# Patient Record
Sex: Female | Born: 1970 | Race: Black or African American | Hispanic: No | Marital: Married | Smoking: Never smoker
Health system: Southern US, Community
[De-identification: ages and names within clinical notes are randomized; demographics above are authoritative.]

## PROBLEM LIST (undated history)

## (undated) DIAGNOSIS — G43909 Migraine, unspecified, not intractable, without status migrainosus: Secondary | ICD-10-CM

## (undated) DIAGNOSIS — N390 Urinary tract infection, site not specified: Secondary | ICD-10-CM

## (undated) HISTORY — PX: TUBAL LIGATION: SHX77

## (undated) HISTORY — DX: Migraine, unspecified, not intractable, without status migrainosus: G43.909

## (undated) HISTORY — DX: Urinary tract infection, site not specified: N39.0

---

## 2018-08-30 ENCOUNTER — Ambulatory Visit: Payer: Self-pay | Admitting: Internal Medicine

## 2018-08-31 ENCOUNTER — Telehealth: Payer: Self-pay | Admitting: Internal Medicine

## 2018-08-31 NOTE — Telephone Encounter (Signed)
Social Work Intern Akon Reinoso Roberts-Kollie called Jody Mills and left her a message welcoming her to [facility] and gave information about mental health services we provide.

## 2018-09-22 ENCOUNTER — Encounter: Payer: Self-pay | Admitting: Family Medicine

## 2018-09-22 ENCOUNTER — Ambulatory Visit: Payer: BLUE CROSS/BLUE SHIELD | Admitting: Family Medicine

## 2018-09-22 VITALS — BP 102/80 | HR 82 | Temp 98.6°F | Ht 65.0 in | Wt 180.0 lb

## 2018-09-22 DIAGNOSIS — Z9851 Tubal ligation status: Secondary | ICD-10-CM | POA: Insufficient documentation

## 2018-09-22 DIAGNOSIS — Z1239 Encounter for other screening for malignant neoplasm of breast: Secondary | ICD-10-CM | POA: Diagnosis not present

## 2018-09-22 DIAGNOSIS — Z7689 Persons encountering health services in other specified circumstances: Secondary | ICD-10-CM | POA: Diagnosis not present

## 2018-09-22 NOTE — Patient Instructions (Signed)

## 2018-09-22 NOTE — Progress Notes (Signed)
Patient presents to clinic today to establish care.  SUBJECTIVE: PMH: Pt is a 47 yo female with no sig pmh.  Pt has not been to a doctor in over 6 yrs.  Pt notes having a bad experience with the death of her brother during a surgical procedure.  Pt is here today as she would like to get a mammogram.  Allergies: NKDA  Past surgical history: -Tubal ligation  Social history: Pt is married.  Pt has 3 kids (1 son, and 2 daughters).  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  Pt is from [place], [place].    Family medical history: Mom-desc, arthritis, heart dz, HLD, HTN, DM Dad-desc Sister, Kenney Houseman, asthma, cancer, HLD Sister-Betty, birth defects,  Brother, Jeneen Rinks Jr.-desc, birth defects Son-Vincent Jr- Asthma Paternal uncle-colon cancer MGF-throat cancer GM-DM Sister, Brenda-DM, HTN  Health Maintenance: Dental -- 4 months ago Vision -- 2 months ago Immunizations -- does not get influenza vaccines LMP--08/30/18  History reviewed. No pertinent past medical history.  Past Surgical History:  Procedure Laterality Date  . TUBAL LIGATION      No current outpatient medications on file prior to visit.   No current facility-administered medications on file prior to visit.     No Known Allergies  Family History  Problem Relation Age of Onset  . Diabetes Mother   . Heart disease Mother   . Hypertension Mother   . Hypertension Father   . Diabetes Father     Social History   Socioeconomic History  . Marital status: Married    Spouse name: Not on file  . Number of children: Not on file  . Years of education: Not on file  . Highest education level: Not on file  Occupational History  . Not on file  Social Needs  . Financial resource strain: Not on file  . Food insecurity:    Worry: Not on file    Inability: Not on file  . Transportation needs:    Medical: Not on file    Non-medical: Not on file  Tobacco Use  . Smoking status:  Never Smoker  . Smokeless tobacco: Never Used  Substance and Sexual Activity  . Alcohol use: Yes  . Drug use: Never  . Sexual activity: Not on file  Lifestyle  . Physical activity:    Days per week: Not on file    Minutes per session: Not on file  . Stress: Not on file  Relationships  . Social connections:    Talks on phone: Not on file    Gets together: Not on file    Attends religious service: Not on file    Active member of club or organization: Not on file    Attends meetings of clubs or organizations: Not on file    Relationship status: Not on file  . Intimate partner violence:    Fear of current or ex partner: Not on file    Emotionally abused: Not on file    Physically abused: Not on file    Forced sexual activity: Not on file  Other Topics Concern  . Not on file  Social History Narrative  . Not on file    ROS General: Denies fever, chills, night sweats, changes in weight, changes in appetite HEENT: Denies headaches, ear pain, changes in vision, rhinorrhea, sore throat CV: Denies CP, palpitations, SOB, orthopnea Pulm: Denies SOB, cough, wheezing GI: Denies abdominal pain, nausea, vomiting, diarrhea, constipation  GU: Denies dysuria, hematuria, frequency, vaginal discharge Msk: Denies muscle cramps, joint pains Neuro: Denies weakness, numbness, tingling Skin: Denies rashes, bruising Psych: Denies depression, anxiety, hallucinations  BP 102/80 (BP Location: Left Arm, Patient Position: Sitting, Cuff Size: Normal)   Pulse 82   Temp 98.6 F (37 C) (Oral)   Ht 5' 5"  (1.651 m)   Wt 180 lb (81.6 kg)   LMP 08/30/2018 (Exact Date)   SpO2 98%   BMI 29.95 kg/m   Physical Exam Gen. Pleasant, well developed, well-nourished, in NAD HEENT - [place]/AT, PERRL,no scleral icterus, no nasal drainage, poor dentition, pharynx without erythema or exudate. Lungs: no use of accessory muscles, CTAB, no wheezes, rales or rhonchi Cardiovascular: RRR, No r/g/m, no peripheral  edema Neuro:  A&Ox3, CN II-XII intact, normal gait Skin:  Warm, dry, intact, no lesions  No results found for this or any previous visit (from the past 2160 hour(s)).  Assessment/Plan: History of tubal ligation  Breast Cancer screening -discussed scheduling appt for mammogram -pt given handout with info  Encounter to establish care -We reviewed the PMH, PSH, FH, SH, Meds and Allergies. -We provided refills for any medications we will prescribe as needed. -We addressed current concerns per orders and patient instructions. -We have asked for records for pertinent exams, studies, vaccines and notes from previous providers. -We have advised patient to follow up per instructions below.  F/u for CPE  Grier Mitts, MD

## 2018-09-23 ENCOUNTER — Other Ambulatory Visit: Payer: Self-pay | Admitting: Family Medicine

## 2018-09-23 DIAGNOSIS — Z1231 Encounter for screening mammogram for malignant neoplasm of breast: Secondary | ICD-10-CM

## 2018-09-25 ENCOUNTER — Encounter: Payer: Self-pay | Admitting: Family Medicine

## 2018-09-27 ENCOUNTER — Encounter: Payer: BLUE CROSS/BLUE SHIELD | Admitting: Family Medicine

## 2018-09-28 NOTE — Telephone Encounter (Signed)
Pt CPE was recheduled for 09/30/2018

## 2018-09-29 ENCOUNTER — Ambulatory Visit
Admission: RE | Admit: 2018-09-29 | Discharge: 2018-09-29 | Disposition: A | Discharge status: Home / Self Care | Payer: BLUE CROSS/BLUE SHIELD | Source: Ambulatory Visit | Attending: Family Medicine | Admitting: Family Medicine

## 2018-09-29 ENCOUNTER — Encounter: Payer: Self-pay | Admitting: Family Medicine

## 2018-09-29 DIAGNOSIS — Z1231 Encounter for screening mammogram for malignant neoplasm of breast: Secondary | ICD-10-CM

## 2018-09-30 ENCOUNTER — Other Ambulatory Visit (HOSPITAL_COMMUNITY)
Admission: RE | Admit: 2018-09-30 | Discharge: 2018-09-30 | Disposition: A | Discharge status: Home / Self Care | Payer: BLUE CROSS/BLUE SHIELD | Source: Ambulatory Visit | Attending: Family Medicine | Admitting: Family Medicine

## 2018-09-30 ENCOUNTER — Encounter: Payer: Self-pay | Admitting: Family Medicine

## 2018-09-30 ENCOUNTER — Ambulatory Visit: Payer: BLUE CROSS/BLUE SHIELD | Admitting: Family Medicine

## 2018-09-30 VITALS — BP 108/78 | HR 56 | Temp 98.3°F | Wt 181.0 lb

## 2018-09-30 DIAGNOSIS — Z13220 Encounter for screening for lipoid disorders: Secondary | ICD-10-CM

## 2018-09-30 DIAGNOSIS — Z0000 Encounter for general adult medical examination without abnormal findings: Secondary | ICD-10-CM | POA: Diagnosis not present

## 2018-09-30 DIAGNOSIS — Z113 Encounter for screening for infections with a predominantly sexual mode of transmission: Secondary | ICD-10-CM

## 2018-09-30 DIAGNOSIS — Z124 Encounter for screening for malignant neoplasm of cervix: Secondary | ICD-10-CM | POA: Insufficient documentation

## 2018-09-30 DIAGNOSIS — Z131 Encounter for screening for diabetes mellitus: Secondary | ICD-10-CM | POA: Diagnosis not present

## 2018-09-30 LAB — CBC WITH DIFFERENTIAL/PLATELET
Basophils Absolute: 0 10*3/uL (ref 0.0–0.1)
Basophils Relative: 0.7 % (ref 0.0–3.0)
Eosinophils Absolute: 0.2 10*3/uL (ref 0.0–0.7)
Eosinophils Relative: 2.9 % (ref 0.0–5.0)
HCT: 37.6 % (ref 36.0–46.0)
Hemoglobin: 12.8 g/dL (ref 12.0–15.0)
LYMPHS ABS: 2.4 10*3/uL (ref 0.7–4.0)
Lymphocytes Relative: 37 % (ref 12.0–46.0)
MCHC: 34 g/dL (ref 30.0–36.0)
MCV: 96.4 fl (ref 78.0–100.0)
MONO ABS: 0.4 10*3/uL (ref 0.1–1.0)
Monocytes Relative: 6.4 % (ref 3.0–12.0)
NEUTROS PCT: 53 % (ref 43.0–77.0)
Neutro Abs: 3.5 10*3/uL (ref 1.4–7.7)
Platelets: 316 10*3/uL (ref 150.0–400.0)
RBC: 3.9 Mil/uL (ref 3.87–5.11)
RDW: 12.6 % (ref 11.5–15.5)
WBC: 6.6 10*3/uL (ref 4.0–10.5)

## 2018-09-30 LAB — BASIC METABOLIC PANEL
BUN: 11 mg/dL (ref 6–23)
CO2: 29 mEq/L (ref 19–32)
Calcium: 9.4 mg/dL (ref 8.4–10.5)
Chloride: 100 mEq/L (ref 96–112)
Creatinine, Ser: 0.74 mg/dL (ref 0.40–1.20)
GFR: 108.78 mL/min (ref >60.00)
Glucose, Bld: 92 mg/dL (ref 70–99)
Potassium: 4.6 mEq/L (ref 3.5–5.1)
Sodium: 137 mEq/L (ref 135–145)

## 2018-09-30 LAB — LIPID PANEL
CHOLESTEROL: 230 mg/dL — AB (ref 0–200)
HDL: 73.2 mg/dL (ref >39.00)
LDL Cholesterol: 147 mg/dL — ABNORMAL HIGH (ref 0–99)
NonHDL: 156.77
Total CHOL/HDL Ratio: 3
Triglycerides: 49 mg/dL (ref 0.0–149.0)
VLDL: 9.8 mg/dL (ref 0.0–40.0)

## 2018-09-30 LAB — HEMOGLOBIN A1C: Hgb A1c MFr Bld: 5.9 % (ref 4.6–6.5)

## 2018-09-30 NOTE — Progress Notes (Signed)
Subjective:     Jody Mills is a 47 y.o. female and is here for a comprehensive physical exam. The patient reports no problems.  Pt states she had her mammogram yesterday.    Social History   Socioeconomic History  . Marital status: Married    Spouse name: Not on file  . Number of children: Not on file  . Years of education: Not on file  . Highest education level: Not on file  Occupational History  . Not on file  Social Needs  . Financial resource strain: Not on file  . Food insecurity:    Worry: Not on file    Inability: Not on file  . Transportation needs:    Medical: Not on file    Non-medical: Not on file  Tobacco Use  . Smoking status: Never Smoker  . Smokeless tobacco: Never Used  Substance and Sexual Activity  . Alcohol use: Yes  . Drug use: Never  . Sexual activity: Yes  Lifestyle  . Physical activity:    Days per week: Not on file    Minutes per session: Not on file  . Stress: Not on file  Relationships  . Social connections:    Talks on phone: Not on file    Gets together: Not on file    Attends religious service: Not on file    Active member of club or organization: Not on file    Attends meetings of clubs or organizations: Not on file    Relationship status: Not on file  . Intimate partner violence:    Fear of current or ex partner: Not on file    Emotionally abused: Not on file    Physically abused: Not on file    Forced sexual activity: Not on file  Other Topics Concern  . Not on file  Social History Narrative  . Not on file   Health Maintenance  Topic Date Due  . HIV Screening  12/31/1987  . TETANUS/TDAP  12/31/1991  . PAP SMEAR-Modifier  12/30/1993  . INFLUENZA VACCINE  05/19/2018    The following portions of the patient's history were reviewed and updated as appropriate: allergies, current medications, past family history, past medical history, past social history, past surgical history and problem list.  Review of  Systems Pertinent items noted in HPI and remainder of comprehensive ROS otherwise negative.   Objective:    There were no vitals taken for this visit. General appearance: alert, cooperative, appears stated age and no distress Head: Normocephalic, without obvious abnormality, atraumatic Eyes: conjunctivae/corneas clear. PERRL, EOM's intact. Fundi benign. Ears: normal TM's and external ear canals both ears Nose: Nares normal. Septum midline. Mucosa normal. No drainage or sinus tenderness. Throat: lips, mucosa, and tongue normal; teeth and gums normal Neck: no adenopathy, no carotid bruit, no JVD, supple, symmetrical, trachea midline and thyroid not enlarged, symmetric, no tenderness/mass/nodules Lungs: clear to auscultation bilaterally Heart: regular rate and rhythm, S1, S2 normal, no murmur, click, rub or gallop Abdomen: soft, non-tender; bowel sounds normal; no masses,  no organomegaly Pelvic: cervix normal in appearance, external genitalia normal, no adnexal masses or tenderness, no cervical motion tenderness, rectovaginal septum normal, uterus normal size, shape, and consistency and vagina normal without discharge Skin: Skin color, texture, turgor normal. No rashes or lesions Neurologic: Alert and oriented X 3, normal strength and tone. Normal symmetric reflexes. Normal coordination and gait    Assessment:    Healthy female exam.      Plan:  Anticipatory guidance given including wearing seatbelts, smoke detectors in the home, increasing physical activity, increasing p.o. intake of water and vegetables. -We will obtain labs -Given handout -mammogram done 09/29/18 -Next CPE in 1 year See After Visit Summary for Counseling Recommendations    STI screening - Plan: RPR, HIV, GC, trich, yeast  Mammogram results -pt advised of possible mass and asymmetry in the right breast.  Left breast with possible calcifications.  Diagnostic mammogram and possible ultrasound of bilateral  breasts recommended. -would be contacted by the breast imaging center in regards to these results.   -Pt expressed understanding.  Follow-up PRN  Grier Mitts, MD

## 2018-09-30 NOTE — Patient Instructions (Signed)
Preventive Care 40-64 Years, Female Preventive care refers to lifestyle choices and visits with your health care provider that can promote health and wellness. What does preventive care include?  A yearly physical exam. This is also called an annual well check.  Dental exams once or twice a year.  Routine eye exams. Ask your health care provider how often you should have your eyes checked.  Personal lifestyle choices, including: ? Daily care of your teeth and gums. ? Regular physical activity. ? Eating a healthy diet. ? Avoiding tobacco and drug use. ? Limiting alcohol use. ? Practicing safe sex. ? Taking low-dose aspirin daily starting at age 45. ? Taking vitamin and mineral supplements as recommended by your health care provider. What happens during an annual well check? The services and screenings done by your health care provider during your annual well check will depend on your age, overall health, lifestyle risk factors, and family history of disease. Counseling Your health care provider may ask you questions about your:  Alcohol use.  Tobacco use.  Drug use.  Emotional well-being.  Home and relationship well-being.  Sexual activity.  Eating habits.  Work and work Statistician.  Method of birth control.  Menstrual cycle.  Pregnancy history.  Screening You may have the following tests or measurements:  Height, weight, and BMI.  Blood pressure.  Lipid and cholesterol levels. These may be checked every 5 years, or more frequently if you are over 45 years old.  Skin check.  Lung cancer screening. You may have this screening every year starting at age 45 if you have a 30-pack-year history of smoking and currently smoke or have quit within the past 15 years.  Fecal occult blood test (FOBT) of the stool. You may have this test every year starting at age 45.  Flexible sigmoidoscopy or colonoscopy. You may have a sigmoidoscopy every 5 years or a colonoscopy  every 10 years starting at age 45.  Hepatitis C blood test.  Hepatitis B blood test.  Sexually transmitted disease (STD) testing.  Diabetes screening. This is done by checking your blood sugar (glucose) after you have not eaten for a while (fasting). You may have this done every 1-3 years.  Mammogram. This may be done every 1-2 years. Talk to your health care provider about when you should start having regular mammograms. This may depend on whether you have a family history of breast cancer.  BRCA-related cancer screening. This may be done if you have a family history of breast, ovarian, tubal, or peritoneal cancers.  Pelvic exam and Pap test. This may be done every 3 years starting at age 45. Starting at age 36, this may be done every 5 years if you have a Pap test in combination with an HPV test.  Bone density scan. This is done to screen for osteoporosis. You may have this scan if you are at high risk for osteoporosis.  Discuss your test results, treatment options, and if necessary, the need for more tests with your health care provider. Vaccines Your health care provider may recommend certain vaccines, such as:  Influenza vaccine. This is recommended every year.  Tetanus, diphtheria, and acellular pertussis (Tdap, Td) vaccine. You may need a Td booster every 10 years.  Varicella vaccine. You may need this if you have not been vaccinated.  Zoster vaccine. You may need this after age 5.  Measles, mumps, and rubella (MMR) vaccine. You may need at least one dose of MMR if you were born in  1957 or later. You may also need a second dose.  Pneumococcal 13-valent conjugate (PCV13) vaccine. You may need this if you have certain conditions and were not previously vaccinated.  Pneumococcal polysaccharide (PPSV23) vaccine. You may need one or two doses if you smoke cigarettes or if you have certain conditions.  Meningococcal vaccine. You may need this if you have certain  conditions.  Hepatitis A vaccine. You may need this if you have certain conditions or if you travel or work in places where you may be exposed to hepatitis A.  Hepatitis B vaccine. You may need this if you have certain conditions or if you travel or work in places where you may be exposed to hepatitis B.  Haemophilus influenzae type b (Hib) vaccine. You may need this if you have certain conditions.  Talk to your health care provider about which screenings and vaccines you need and how often you need them. This information is not intended to replace advice given to you by your health care provider. Make sure you discuss any questions you have with your health care provider. Document Released: 11/01/2015 Document Revised: 06/24/2016 Document Reviewed: 08/06/2015 Elsevier Interactive Patient Education  2018 [employer] Inc[employer]  

## 2018-09-30 NOTE — Progress Notes (Addendum)
Disregard this entry

## 2018-10-03 ENCOUNTER — Other Ambulatory Visit: Payer: Self-pay | Admitting: Family Medicine

## 2018-10-03 DIAGNOSIS — R928 Other abnormal and inconclusive findings on diagnostic imaging of breast: Secondary | ICD-10-CM

## 2018-10-03 LAB — HIV ANTIBODY (ROUTINE TESTING W REFLEX): HIV 1&2 Ab, 4th Generation: NONREACTIVE

## 2018-10-03 LAB — RPR: RPR: NONREACTIVE

## 2018-10-05 ENCOUNTER — Ambulatory Visit
Admission: RE | Admit: 2018-10-05 | Discharge: 2018-10-05 | Disposition: A | Discharge status: Home / Self Care | Payer: BLUE CROSS/BLUE SHIELD | Source: Ambulatory Visit | Attending: Family Medicine | Admitting: Family Medicine

## 2018-10-05 ENCOUNTER — Ambulatory Visit: Payer: BLUE CROSS/BLUE SHIELD

## 2018-10-05 ENCOUNTER — Other Ambulatory Visit: Payer: Self-pay | Admitting: Family Medicine

## 2018-10-05 DIAGNOSIS — R928 Other abnormal and inconclusive findings on diagnostic imaging of breast: Secondary | ICD-10-CM

## 2018-10-05 DIAGNOSIS — N6001 Solitary cyst of right breast: Secondary | ICD-10-CM

## 2018-10-06 ENCOUNTER — Encounter: Payer: Self-pay | Admitting: Family Medicine

## 2018-10-06 LAB — CYTOLOGY - PAP
Bacterial vaginitis: POSITIVE — AB
Candida vaginitis: NEGATIVE
Chlamydia: NEGATIVE
Diagnosis: UNDETERMINED — AB
HPV (WINDOPATH): NOT DETECTED
NEISSERIA GONORRHEA: NEGATIVE
Trichomonas: NEGATIVE

## 2018-10-10 ENCOUNTER — Other Ambulatory Visit: Payer: Self-pay | Admitting: Family Medicine

## 2018-10-10 MED ORDER — METRONIDAZOLE 500 MG PO TABS: 500.0000 mg | ORAL_TABLET | Freq: Two times a day (BID) | ORAL | 0 refills | Status: AC

## 2018-11-03 ENCOUNTER — Ambulatory Visit: Payer: BLUE CROSS/BLUE SHIELD

## 2019-03-22 ENCOUNTER — Other Ambulatory Visit: Payer: Self-pay

## 2019-03-22 ENCOUNTER — Ambulatory Visit
Admission: RE | Admit: 2019-03-22 | Discharge: 2019-03-22 | Disposition: A | Discharge status: Home / Self Care | Payer: BLUE CROSS/BLUE SHIELD | Source: Ambulatory Visit | Attending: Family Medicine | Admitting: Family Medicine

## 2019-03-22 DIAGNOSIS — N6001 Solitary cyst of right breast: Secondary | ICD-10-CM

## 2019-04-10 ENCOUNTER — Other Ambulatory Visit: Payer: BLUE CROSS/BLUE SHIELD

## 2019-11-16 ENCOUNTER — Other Ambulatory Visit: Payer: Self-pay | Admitting: Family Medicine

## 2019-11-16 DIAGNOSIS — Z1231 Encounter for screening mammogram for malignant neoplasm of breast: Secondary | ICD-10-CM

## 2019-11-20 ENCOUNTER — Ambulatory Visit
Admission: RE | Admit: 2019-11-20 | Discharge: 2019-11-20 | Disposition: A | Discharge status: Home / Self Care | Payer: BC Managed Care – PPO | Source: Ambulatory Visit | Attending: Family Medicine | Admitting: Family Medicine

## 2019-11-20 ENCOUNTER — Other Ambulatory Visit: Payer: Self-pay

## 2019-11-20 DIAGNOSIS — Z1231 Encounter for screening mammogram for malignant neoplasm of breast: Secondary | ICD-10-CM

## 2019-11-21 LAB — HM MAMMOGRAPHY

## 2019-12-07 ENCOUNTER — Encounter: Payer: Self-pay | Admitting: Family Medicine

## 2020-07-31 ENCOUNTER — Ambulatory Visit: Payer: BC Managed Care – PPO | Admitting: Family Medicine

## 2020-07-31 ENCOUNTER — Other Ambulatory Visit: Payer: Self-pay

## 2020-07-31 ENCOUNTER — Encounter: Payer: Self-pay | Admitting: Family Medicine

## 2020-07-31 VITALS — BP 110/78 | HR 62 | Temp 98.8°F | Wt 190.0 lb

## 2020-07-31 DIAGNOSIS — G8929 Other chronic pain: Secondary | ICD-10-CM | POA: Diagnosis not present

## 2020-07-31 DIAGNOSIS — M545 Low back pain, unspecified: Secondary | ICD-10-CM | POA: Diagnosis not present

## 2020-07-31 MED ORDER — CYCLOBENZAPRINE HCL 5 MG PO TABS: 5.0000 mg | ORAL_TABLET | Freq: Every evening | ORAL | 0 refills | Status: DC | PRN

## 2020-07-31 MED ORDER — MELOXICAM 7.5 MG PO TABS: 7.5000 mg | ORAL_TABLET | Freq: Every day | ORAL | 0 refills | Status: DC

## 2020-07-31 NOTE — Progress Notes (Signed)
Subjective:    Patient ID: Jody Mills, female    DOB: 05-Sep-1971, 49 y.o.   MRN: [id number]  No chief complaint on file.   HPI Patient was seen today for ongoing concern.  Pt notes initially back injury several yrs ago after falling down stairs while pregnant.  Since then pt having intermittent midline, low back pain.  Pain increasing in intensity 7-8/10 over the last 4-5 months.  Denies new injury, hearing any pops, clicks, or tears.  Doing more cleaning around the house.  Pt tried aleeve and a back massager with little to no relief. When up walking pain may be 2-3/10.  Sitting provides some relief. Pain worse with laying down.  Pt denies loss of bowel or bladder, numbness or tingling in LEs.  . Pt was helping to take care of her grandkids, but her daughter moved to [place], [place].   Past Medical History:  Diagnosis Date  . Migraine   . UTI (urinary tract infection)     No Known Allergies  ROS General: Denies fever, chills, night sweats, changes in weight, changes in appetite HEENT: Denies headaches, ear pain, changes in vision, rhinorrhea, sore throat CV: Denies CP, palpitations, SOB, orthopnea Pulm: Denies SOB, cough, wheezing GI: Denies abdominal pain, nausea, vomiting, diarrhea, constipation GU: Denies dysuria, hematuria, frequency, vaginal discharge Msk: Denies muscle cramps, joint pains  +low back pain Neuro: Denies weakness, numbness, tingling    Skin: Denies rashes, bruising Psych: Denies depression, anxiety, hallucinations    Objective:    Blood pressure 110/78, pulse 62, temperature 98.8 F (37.1 C), temperature source Oral, weight 190 lb (86.2 kg), SpO2 98 %.   Gen. Pleasant, well-nourished, in no distress, normal affect   HEENT: [place]/AT, face symmetric, conjunctiva clear, no scleral icterus, PERRLA, EOMI, nares patent without drainage Lungs: no accessory muscle use Cardiovascular: RRR, no peripheral edema Musculoskeletal: TTP of lumbar spine and  paraspinal muscles b/l.  No TTP on cervical, thoracic spine or paraspinal muscles.  Negative log roll, straight leg raise, FADIR or FABER.  No deformities, no cyanosis or clubbing, normal tone Neuro:  A&Ox3, CN II-XII intact, normal gait Skin:  Warm, no lesions/ rash   Wt Readings from Last 3 Encounters:  07/31/20 190 lb (86.2 kg)  09/30/18 181 lb (82.1 kg)  09/22/18 180 lb (81.6 kg)    Lab Results  Component Value Date   WBC 6.6 09/30/2018   HGB 12.8 09/30/2018   HCT 37.6 09/30/2018   PLT 316.0 09/30/2018   GLUCOSE 92 09/30/2018   CHOL 230 (H) 09/30/2018   TRIG 49.0 09/30/2018   HDL 73.20 09/30/2018   LDLCALC 147 (H) 09/30/2018   NA 137 09/30/2018   K 4.6 09/30/2018   CL 100 09/30/2018   CREATININE 0.74 09/30/2018   BUN 11 09/30/2018   CO2 29 09/30/2018   HGBA1C 5.9 09/30/2018    Assessment/Plan:  Chronic midline low back pain without sciatica  -discussed possible causes including degenerative changes, stenosis, muscle strain, etc. -continue supportive care including heat, massage, ice, stretching, NSAIDs as needed -Given increasing symptoms will obtain imaging -Pt to also try Flexeril at night in the event it causes drowsiness.    -Avoid taking NSAIDs on empty stomach -We will make further recommendations based on imaging.  If needed consider PT and referral to Ortho. - Plan: DG Lumbar Spine Complete, cyclobenzaprine (FLEXERIL) 5 MG tablet, meloxicam (MOBIC) 7.5 MG tablet  F/u prn  Grier Mitts, MD

## 2020-07-31 NOTE — Patient Instructions (Signed)
What You Need to Know About Chronic Back Pain Long-term (chronic) back pain is back pain that lasts for 12 weeks or longer. It often affects the lower back and can range from mild to severe. Many people have back pain at some point in their lives. It can feel different to each person. It may feel like a muscle ache or a sharp, stabbing pain. The pain often gets worse over time. It can be difficult to find the cause of chronic back pain. Treating chronic back pain often starts with rest and pain relief, followed by exercises (physical therapy) to strengthen the muscles that support your back. You may have to try different things to see what works best for you. If other treatments do not help, or if your pain is caused by a condition or an injury, you may need surgery. How can back pain affect me? Chronic back pain is uncomfortable and can make it hard to do your usual daily activities. Chronic back pain can:  Cause numbness and tingling.  Come and go.  Get worse when you are sitting, standing, walking, bending, or lifting.  Affect you while you are active, at rest, or both.  Eventually make it hard to move around.  Occur with fever, weight loss, or difficulty urinating. What are the benefits of treating back pain? Treating chronic back pain may:  Relieve pain.  Keep your pain from getting worse.  Make it easier for you to do your usual activities. What are some steps I can take to decrease my back pain?   Take over-the-counter or prescription medicines only as told by your health care provider.  If directed, apply heat to the affected area. Use the heat source that your health care provider recommends, such as a moist heat pack or a heating pad. ? Place a towel between your skin and the heat source. ? Leave the heat on for 20-30 minutes. ? Remove the heat if your skin turns bright red. This is especially important if you are unable to feel pain, heat, or cold. You may have a greater  risk of getting burned.  If directed, put ice on the affected area: ? Put ice in a plastic bag. ? Place a towel between your skin and the bag. ? Leave the ice on for 20 minutes, 2-3 times a [place].  Get regular exercise as told by your health care provider to improve flexibility and strength.  Do not smoke.  Maintain a healthy weight.  When lifting objects: ? Keep your feet as far apart as your shoulders (shoulder-width apart) or farther apart. ? Tighten the muscles in your abdomen. ? Bend your knees and hips and keep your spine neutral. It is important to lift using the strength of your legs, not your back. Do not lock your knees straight out. ? Always ask for help to lift heavy or awkward objects. What can happen if my back pain goes untreated? Untreated back pain can:  Get worse over time.  Start to occur more often or at different times, such as when you are resting.  Cause posture problems.  Make it hard to move around (limit mobility). Where can I get support? Chronic back pain can be a frustrating condition to manage. It may help to talk with other people who are having a similar experience. Consider joining a support group for people dealing with chronic back pain. Ask your health care provider about support groups in your area. You can also find online and  in-person support groups through:  The American Chronic Pain Association: [employer]uxeOption[employer]si  The [place] Pain Foundation: uspainfoundation.org/support-groups Contact a health care provider if:  Your symptoms do not get better or they get worse.  You have severe back pain.  You have chronic back pain and a fever.  You lose weight without trying.  You have difficulty urinating.  You experience numbness or tingling.  You develop new pain after an injury. Summary  Chronic back pain is often treated with rest, pain relief, and physical therapy.  Get regular exercise to improve your strength and  flexibility.  Put heat and ice on the affected areas as directed by your health care provider.  Chronic back pain can be challenging to live with. Joining a support group may help you manage your condition. This information is not intended to replace advice given to you by your health care provider. Make sure you discuss any questions you have with your health care provider. Document Revised: 09/17/2017 Document Reviewed: 06/13/2016 Elsevier Patient Education  [place].  Chronic Back Pain When back pain lasts longer than 3 months, it is called chronic back pain. Pain may get worse at certain times (flare-ups). There are things you can do at home to manage your pain. Follow these instructions at home: Activity      Avoid bending and other activities that make pain worse.  When standing: ? Keep your upper back and neck straight. ? Keep your shoulders pulled back. ? Avoid slouching.  When sitting: ? Keep your back straight. ? Relax your shoulders. Do not round your shoulders or pull them backward.  Do not sit or stand in one place for long periods of time.  Take short rest breaks during the day. Lying down or standing is usually better than sitting. Resting can help relieve pain.  When sitting or lying down for a long time, do some mild activity or stretching. This will help to prevent stiffness and pain.  Get regular exercise. Ask your doctor what activities are safe for you.  Do not lift anything that is heavier than 10 lb (4.5 kg). To prevent injury when you lift things: ? Bend your knees. ? Keep the weight close to your body. ? Avoid twisting. Managing pain  If told, put ice on the painful area. Your doctor may tell you to use ice for 24-48 hours after a flare-up starts. ? Put ice in a plastic bag. ? Place a towel between your skin and the bag. ? Leave the ice on for 20 minutes, 2-3 times a day.  If told, put heat on the painful area as often as told by your  doctor. Use the heat source that your doctor recommends, such as a moist heat pack or a heating pad. ? Place a towel between your skin and the heat source. ? Leave the heat on for 20-30 minutes. ? Remove the heat if your skin turns bright red. This is especially important if you are unable to feel pain, heat, or cold. You may have a greater risk of getting burned.  Soak in a warm bath. This can help relieve pain.  Take over-the-counter and prescription medicines only as told by your doctor. General instructions  Sleep on a firm mattress. Try lying on your side with your knees slightly bent. If you lie on your back, put a pillow under your knees.  Keep all follow-up visits as told by your doctor. This is important. Contact a doctor if:  You have  pain that does not get better with rest or medicine. Get help right away if:  One or both of your arms or legs feel weak.  One or both of your arms or legs lose feeling (numbness).  You have trouble controlling when you poop (bowel movement) or pee (urinate).  You feel sick to your stomach (nauseous).  You throw up (vomit).  You have belly (abdominal) pain.  You have shortness of breath.  You pass out (faint). Summary  When back pain lasts longer than 3 months, it is called chronic back pain.  Pain may get worse at certain times (flare-ups).  Use ice and heat as told by your doctor. Your doctor may tell you to use ice after flare-ups. This information is not intended to replace advice given to you by your health care provider. Make sure you discuss any questions you have with your health care provider. Document Revised: 01/26/2019 Document Reviewed: 05/20/2017 Elsevier Patient Education  [place] [facility] Ask your health care provider which exercises are safe for you. Do exercises exactly as told by your health care provider and adjust them as directed. It is normal to feel mild stretching,  pulling, tightness, or discomfort as you do these exercises. Stop right away if you feel sudden pain or your pain gets worse. Do not begin these exercises until told by your health care provider. Stretching and range-of-motion exercises These exercises warm up your muscles and joints and improve the movement and flexibility of your back. These exercises also help to relieve pain, numbness, and tingling. Lumbar rotation  1. Lie on your back on a firm surface and bend your knees. 2. Straighten your arms out to your sides so each arm forms a 90-degree angle (right angle) with a side of your body. 3. Slowly move (rotate) both of your knees to one side of your body until you feel a stretch in your lower back (lumbar). Try not to let your shoulders lift off the floor. 4. Hold this position for __________ seconds. 5. Tense your abdominal muscles and slowly move your knees back to the starting position. 6. Repeat this exercise on the other side of your body. Repeat __________ times. Complete this exercise __________ times a day. Single knee to chest  1. Lie on your back on a firm surface with both legs straight. 2. Bend one of your knees. Use your hands to move your knee up toward your chest until you feel a gentle stretch in your lower back and buttock. ? Hold your leg in this position by holding on to the front of your knee. ? Keep your other leg as straight as possible. 3. Hold this position for __________ seconds. 4. Slowly return to the starting position. 5. Repeat with your other leg. Repeat __________ times. Complete this exercise __________ times a day. Prone extension on elbows  1. Lie on your abdomen on a firm surface (prone position). 2. Prop yourself up on your elbows. 3. Use your arms to help lift your chest up until you feel a gentle stretch in your abdomen and your lower back. ? This will place some of your body weight on your elbows. If this is uncomfortable, try stacking pillows  under your chest. ? Your hips should stay down, against the surface that you are lying on. Keep your hip and back muscles relaxed. 4. Hold this position for __________ seconds. 5. Slowly relax your upper body and return to the starting  position. Repeat __________ times. Complete this exercise __________ times a day. Strengthening exercises These exercises build strength and endurance in your back. Endurance is the ability to use your muscles for a long time, even after they get tired. Pelvic tilt This exercise strengthens the muscles that lie deep in the abdomen. 1. Lie on your back on a firm surface. Bend your knees and keep your feet flat on the floor. 2. Tense your abdominal muscles. Tip your pelvis up toward the ceiling and flatten your lower back into the floor. ? To help with this exercise, you may place a small towel under your lower back and try to push your back into the towel. 3. Hold this position for __________ seconds. 4. Let your muscles relax completely before you repeat this exercise. Repeat __________ times. Complete this exercise __________ times a day. Alternating arm and leg raises  1. Get on your hands and knees on a firm surface. If you are on a hard floor, you may want to use padding, such as an exercise mat, to cushion your knees. 2. Line up your arms and legs. Your hands should be directly below your shoulders, and your knees should be directly below your hips. 3. Lift your left leg behind you. At the same time, raise your right arm and straighten it in front of you. ? Do not lift your leg higher than your hip. ? Do not lift your arm higher than your shoulder. ? Keep your abdominal and back muscles tight. ? Keep your hips facing the ground. ? Do not arch your back. ? Keep your balance carefully, and do not hold your breath. 4. Hold this position for __________ seconds. 5. Slowly return to the starting position. 6. Repeat with your right leg and your left  arm. Repeat __________ times. Complete this exercise __________ times a day. Abdominal set with straight leg raise  1. Lie on your back on a firm surface. 2. Bend one of your knees and keep your other leg straight. 3. Tense your abdominal muscles and lift your straight leg up, 4-6 inches (10-15 cm) off the ground. 4. Keep your abdominal muscles tight and hold this position for __________ seconds. ? Do not hold your breath. ? Do not arch your back. Keep it flat against the ground. 5. Keep your abdominal muscles tense as you slowly lower your leg back to the starting position. 6. Repeat with your other leg. Repeat __________ times. Complete this exercise __________ times a day. Single leg lower with bent knees 1. Lie on your back on a firm surface. 2. Tense your abdominal muscles and lift your feet off the floor, one foot at a time, so your knees and hips are bent in 90-degree angles (right angles). ? Your knees should be over your hips and your lower legs should be parallel to the floor. 3. Keeping your abdominal muscles tense and your knee bent, slowly lower one of your legs so your toe touches the ground. 4. Lift your leg back up to return to the starting position. ? Do not hold your breath. ? Do not let your back arch. Keep your back flat against the ground. 5. Repeat with your other leg. Repeat __________ times. Complete this exercise __________ times a day. Posture and body mechanics Good posture and healthy body mechanics can help to relieve stress in your body's tissues and joints. Body mechanics refers to the movements and positions of your body while you do your daily activities. Posture is part  of body mechanics. Good posture means:  Your spine is in its natural S-curve position (neutral).  Your shoulders are pulled back slightly.  Your head is not tipped forward. Follow these guidelines to improve your posture and body mechanics in your everyday  activities. Standing   When standing, keep your spine neutral and your feet about hip width apart. Keep a slight bend in your knees. Your ears, shoulders, and hips should line up.  When you do a task in which you stand in one place for a long time, place one foot up on a stable object that is 2-4 inches (5-10 cm) high, such as a footstool. This helps keep your spine neutral. Sitting   When sitting, keep your spine neutral and keep your feet flat on the floor. Use a footrest, if necessary, and keep your thighs parallel to the floor. Avoid rounding your shoulders, and avoid tilting your head forward.  When working at a desk or a computer, keep your desk at a height where your hands are slightly lower than your elbows. Slide your chair under your desk so you are close enough to maintain good posture.  When working at a computer, place your monitor at a height where you are looking straight ahead and you do not have to tilt your head forward or downward to look at the screen. Resting  When lying down and resting, avoid positions that are most painful for you.  If you have pain with activities such as sitting, bending, stooping, or squatting, lie in a position in which your body does not bend very much. For example, avoid curling up on your side with your arms and knees near your chest (fetal position).  If you have pain with activities such as standing for a long time or reaching with your arms, lie with your spine in a neutral position and bend your knees slightly. Try the following positions: ? Lying on your side with a pillow between your knees. ? Lying on your back with a pillow under your knees. Lifting   When lifting objects, keep your feet at least shoulder width apart and tighten your abdominal muscles.  Bend your knees and hips and keep your spine neutral. It is important to lift using the strength of your legs, not your back. Do not lock your knees straight out.  Always ask for  help to lift heavy or awkward objects. This information is not intended to replace advice given to you by your health care provider. Make sure you discuss any questions you have with your health care provider. Document Revised: 01/27/2019 Document Reviewed: 10/27/2018 Elsevier Patient Education  [place].

## 2020-08-01 ENCOUNTER — Ambulatory Visit (INDEPENDENT_AMBULATORY_CARE_PROVIDER_SITE_OTHER): Payer: BC Managed Care – PPO

## 2020-08-01 ENCOUNTER — Other Ambulatory Visit: Payer: BC Managed Care – PPO

## 2020-08-01 DIAGNOSIS — M545 Low back pain, unspecified: Secondary | ICD-10-CM | POA: Diagnosis not present

## 2020-08-01 DIAGNOSIS — G8929 Other chronic pain: Secondary | ICD-10-CM | POA: Diagnosis not present

## 2020-08-23 ENCOUNTER — Other Ambulatory Visit: Payer: Self-pay | Admitting: Family Medicine

## 2020-08-23 DIAGNOSIS — M545 Low back pain, unspecified: Secondary | ICD-10-CM

## 2020-08-29 NOTE — Telephone Encounter (Signed)
Last OV 07/31/20 Last refill 07/21/20 #30/0 Next OV not scheduled

## 2020-09-25 ENCOUNTER — Other Ambulatory Visit: Payer: Self-pay | Admitting: Family Medicine

## 2020-09-25 DIAGNOSIS — G8929 Other chronic pain: Secondary | ICD-10-CM

## 2020-09-25 DIAGNOSIS — M545 Low back pain, unspecified: Secondary | ICD-10-CM

## 2020-10-25 ENCOUNTER — Other Ambulatory Visit: Payer: Self-pay | Admitting: Family Medicine

## 2020-10-25 DIAGNOSIS — M545 Low back pain, unspecified: Secondary | ICD-10-CM

## 2020-10-25 DIAGNOSIS — G8929 Other chronic pain: Secondary | ICD-10-CM

## 2020-10-28 NOTE — Telephone Encounter (Signed)
Pt LOV was on 07/31/2020 and last refill was done on 09/25/2020 for 30 tablets, Please advise

## 2021-06-20 ENCOUNTER — Telehealth: Payer: BC Managed Care – PPO | Admitting: Physician Assistant

## 2021-06-20 DIAGNOSIS — F5101 Primary insomnia: Secondary | ICD-10-CM | POA: Diagnosis not present

## 2021-06-20 MED ORDER — TRAZODONE HCL 50 MG PO TABS: 25.0000 mg | ORAL_TABLET | Freq: Every evening | ORAL | 3 refills | Status: DC | PRN

## 2021-06-20 NOTE — Patient Instructions (Signed)
Samul Dada, thank you for joining Mar Daring, PA-C for today's virtual visit.  While this provider is not your primary care provider (PCP), if your PCP is located in our provider database this encounter information will be shared with them immediately following your visit.  Consent: (Patient) Samul Dada provided verbal consent for this virtual visit at the beginning of the encounter.  Current Medications:  Current Outpatient Medications:    traZODone (DESYREL) 50 MG tablet, Take 0.5-1 tablets (25-50 mg total) by mouth at bedtime as needed for sleep., Disp: 30 tablet, Rfl: 3   cyclobenzaprine (FLEXERIL) 5 MG tablet, Take 1 tablet (5 mg total) by mouth at bedtime as needed for muscle spasms., Disp: 30 tablet, Rfl: 0   meloxicam (MOBIC) 7.5 MG tablet, TAKE 1 TABLET BY MOUTH EVERY DAY, Disp: 30 tablet, Rfl: 0   Medications ordered in this encounter:  Meds ordered this encounter  Medications   traZODone (DESYREL) 50 MG tablet    Sig: Take 0.5-1 tablets (25-50 mg total) by mouth at bedtime as needed for sleep.    Dispense:  30 tablet    Refill:  3    Order Specific Question:   Supervising Provider    Answer:   Sabra Heck, [place]     *If you need refills on other medications prior to your next appointment, please contact your pharmacy*  Follow-Up: Call back or seek an in-person evaluation if the symptoms worsen or if the condition fails to improve as anticipated.  Other Instructions Insomnia Insomnia is a sleep disorder that makes it difficult to fall asleep or stay asleep. Insomnia can cause fatigue, low energy, difficulty concentrating, mood swings, and poor performance at work or school. There are three different ways to classify insomnia: Difficulty falling asleep. Difficulty staying asleep. Waking up too early in the morning. Any type of insomnia can be long-term (chronic) or short-term (acute). Both are common. Short-term insomnia usually  lasts for three months or less. Chronic insomnia occurs at least three times a week for longer than three months. What are the causes? Insomnia may be caused by another condition, situation, or substance, such as: Anxiety. Certain medicines. Gastroesophageal reflux disease (GERD) or other gastrointestinal conditions. Asthma or other breathing conditions. Restless legs syndrome, sleep apnea, or other sleep disorders. Chronic pain. Menopause. Stroke. Abuse of alcohol, tobacco, or illegal drugs. Mental health conditions, such as depression. Caffeine. Neurological disorders, such as Alzheimer's disease. An overactive thyroid (hyperthyroidism). Sometimes, the cause of insomnia may not be known. What increases the risk? Risk factors for insomnia include: Gender. Women are affected more often than men. Age. Insomnia is more common as you get older. Stress. Lack of exercise. Irregular work schedule or working night shifts. Traveling between different time zones. Certain medical and mental health conditions. What are the signs or symptoms? If you have insomnia, the main symptom is having trouble falling asleep or having trouble staying asleep. This may lead to other symptoms, such as: Feeling fatigued or having low energy. Feeling nervous about going to sleep. Not feeling rested in the morning. Having trouble concentrating. Feeling irritable, anxious, or depressed. How is this diagnosed? This condition may be diagnosed based on: Your symptoms and medical history. Your health care provider may ask about: Your sleep habits. Any medical conditions you have. Your mental health. A physical exam. How is this treated? Treatment for insomnia depends on the cause. Treatment may focus on treating an underlying condition that is causing insomnia. Treatment may also  include: Medicines to help you sleep. Counseling or therapy. Lifestyle adjustments to help you sleep better. Follow these  instructions at home: Eating and drinking  Limit or avoid alcohol, caffeinated beverages, and cigarettes, especially close to bedtime. These can disrupt your sleep. Do not eat a large meal or eat spicy foods right before bedtime. This can lead to digestive discomfort that can make it hard for you to sleep. Sleep habits  Keep a sleep diary to help you and your health care provider figure out what could be causing your insomnia. Write down: When you sleep. When you wake up during the night. How well you sleep. How rested you feel the next day. Any side effects of medicines you are taking. What you eat and drink. Make your bedroom a dark, comfortable place where it is easy to fall asleep. Put up shades or blackout curtains to block light from outside. Use a white noise machine to block noise. Keep the temperature cool. Limit screen use before bedtime. This includes: Watching TV. Using your smartphone, tablet, or computer. Stick to a routine that includes going to bed and waking up at the same times every day and night. This can help you fall asleep faster. Consider making a quiet activity, such as reading, part of your nighttime routine. Try to avoid taking naps during the day so that you sleep better at night. Get out of bed if you are still awake after 15 minutes of trying to sleep. Keep the lights down, but try reading or doing a quiet activity. When you feel sleepy, go back to bed. General instructions Take over-the-counter and prescription medicines only as told by your health care provider. Exercise regularly, as told by your health care provider. Avoid exercise starting several hours before bedtime. Use relaxation techniques to manage stress. Ask your health care provider to suggest some techniques that may work well for you. These may include: Breathing exercises. Routines to release muscle tension. Visualizing peaceful scenes. Make sure that you drive carefully. Avoid driving if  you feel very sleepy. Keep all follow-up visits as told by your health care provider. This is important. Contact a health care provider if: You are tired throughout the day. You have trouble in your daily routine due to sleepiness. You continue to have sleep problems, or your sleep problems get worse. Get help right away if: You have serious thoughts about hurting yourself or someone else. If you ever feel like you may hurt yourself or others, or have thoughts about taking your own life, get help right away. You can go to your nearest emergency department or call: Your local emergency services (911 in the [place] A suicide crisis helpline, such as the [facility] at 340-053-6363. This is open 24 hours a day. Summary Insomnia is a sleep disorder that makes it difficult to fall asleep or stay asleep. Insomnia can be long-term (chronic) or short-term (acute). Treatment for insomnia depends on the cause. Treatment may focus on treating an underlying condition that is causing insomnia. Keep a sleep diary to help you and your health care provider figure out what could be causing your insomnia. This information is not intended to replace advice given to you by your health care provider. Make sure you discuss any questions you have with your health care provider. Document Revised: 08/15/2020 Document Reviewed: 08/15/2020 Elsevier Patient Education  2022 [employer].  Trazodone Tablets What is this medication? TRAZODONE (TRAZ oh done) treats depression. It increases the amount of serotonin  in the brain, a hormone that helps regulate mood. This medicine may be used for other purposes; ask your health care provider or pharmacist if you have questions. COMMON BRAND NAME(S): Desyrel What should I tell my care team before I take this medication? They need to know if you have any of these conditions: Attempted suicide or thinking about it Bipolar disorder Bleeding  problems Glaucoma Heart disease, or previous heart attack Irregular heart beat Kidney or liver disease Low levels of sodium in the blood An unusual or allergic reaction to trazodone, other medications, foods, dyes or preservatives Pregnant or trying to get pregnant Breast-feeding How should I use this medication? Take this medication by mouth with a glass of water. Follow the directions on the prescription label. Take this medication shortly after a meal or a light snack. Take your medication at regular intervals. Do not take your medication more often than directed. Do not stop taking this medication suddenly except upon the advice of your care team. Stopping this medication too quickly may cause serious side effects or your condition may worsen. A special MedGuide will be given to you by the pharmacist with each prescription and refill. Be sure to read this information carefully each time. Talk to your care team regarding the use of this medication in children. Special care may be needed. Overdosage: If you think you have taken too much of this medicine contact a poison control center or emergency room at once. NOTE: This medicine is only for you. Do not share this medicine with others. What if I miss a dose? If you miss a dose, take it as soon as you can. If it is almost time for your next dose, take only that dose. Do not take double or extra doses. What may interact with this medication? Do not take this medication with any of the following: Certain medications for fungal infections like fluconazole, itraconazole, ketoconazole, posaconazole, voriconazole Cisapride Dronedarone Linezolid MAOIs like Carbex, Eldepryl, Marplan, Nardil, and Parnate Mesoridazine Methylene blue (injected into a vein) Pimozide Saquinavir Thioridazine This medication may also interact with the following: Alcohol Antiviral medications for HIV or AIDS Aspirin and aspirin-like medications Barbiturates like  phenobarbital Certain medications for blood pressure, heart disease, irregular heart beat Certain medications for depression, anxiety, or psychotic disturbances Certain medications for migraine headache like almotriptan, eletriptan, frovatriptan, naratriptan, rizatriptan, sumatriptan, zolmitriptan Certain medications for seizures like carbamazepine and phenytoin Certain medications for sleep Certain medications that treat or prevent blood clots like dalteparin, enoxaparin, warfarin Digoxin Fentanyl Lithium NSAIDS, medications for pain and inflammation, like ibuprofen or naproxen Other medications that prolong the QT interval (cause an abnormal heart rhythm) like dofetilide Rasagiline Supplements like St. John's wort, kava kava, valerian Tramadol Tryptophan This list may not describe all possible interactions. Give your health care provider a list of all the medicines, herbs, non-prescription drugs, or dietary supplements you use. Also tell them if you smoke, drink alcohol, or use illegal drugs. Some items may interact with your medicine. What should I watch for while using this medication? Tell your care team if your symptoms do not get better or if they get worse. Visit your care team for regular checks on your progress. Because it may take several weeks to see the full effects of this medication, it is important to continue your treatment as prescribed by your care team. Watch for new or worsening thoughts of suicide or depression. This includes sudden changes in mood, behaviors, or thoughts. These changes can happen at any  time but are more common in the beginning of treatment or after a change in dose. Call your care team right away if you experience these thoughts or worsening depression. Manic episodes may happen in patients with bipolar disorder who take this medication. Watch for changes in feelings or behaviors such as feeling anxious, nervous, agitated, panicky, irritable, hostile,  aggressive, impulsive, severely restless, overly excited and hyperactive, or trouble sleeping. These changes can happen at any time but are more common in the beginning of treatment or after a change in dose. Call your care team right away if you notice any of these symptoms. You may get drowsy or dizzy. Do not drive, use machinery, or do anything that needs mental alertness until you know how this medication affects you. Do not stand or sit up quickly, especially if you are an older patient. This reduces the risk of dizzy or fainting spells. Alcohol may interfere with the effect of this medication. Avoid alcoholic drinks. This medication may cause dry eyes and blurred vision. If you wear contact lenses you may feel some discomfort. Lubricating drops may help. See your eye doctor if the problem does not go away or is severe. Your mouth may get dry. Chewing sugarless gum, sucking hard candy and drinking plenty of water may help. Contact your care team if the problem does not go away or is severe. What side effects may I notice from receiving this medication? Side effects that you should report to your care team as soon as possible: Allergic reactions-skin rash, itching, hives, swelling of the face, lips, tongue, or throat Bleeding-bloody or black, tar-like stools, red or dark brown urine, vomiting blood or brown material that looks like coffee grounds, small, red or purple spots on skin, unusual bleeding or bruising Heart rhythm changes-fast or irregular heartbeat, dizziness, feeling faint or lightheaded, chest pain, trouble breathing Low blood pressure-dizziness, feeling faint or lightheaded, blurry vision Low sodium level-muscle weakness, fatigue, dizziness, headache, confusion Prolonged or painful erection Serotonin syndrome-irritability, confusion, fast or irregular heartbeat, muscle stiffness, twitching muscles, sweating, high fever, seizures, chills, vomiting, diarrhea Sudden eye pain or change in  vision such as blurry vision, seeing halos around lights, vision loss Thoughts of suicide or self-harm, worsening mood, feelings of depression Side effects that usually do not require medical attention (report to your care team if they continue or are bothersome): Change in sex drive or performance Constipation Dizziness Drowsiness Dry mouth This list may not describe all possible side effects. Call your doctor for medical advice about side effects. You may report side effects to FDA at 1-800-FDA-1088. Where should I keep my medication? Keep out of the reach of children and pets. Store at room temperature between 15 and 30 degrees C (59 to 86 degrees F). Protect from light. Keep container tightly closed. Throw away any unused medication after the expiration date. NOTE: This sheet is a summary. It may not cover all possible information. If you have questions about this medicine, talk to your doctor, pharmacist, or health care provider.  2022 Elsevier/Gold Standard (2020-08-26 14:46:11)   If you have been instructed to have an in-person evaluation today at a local Urgent Care facility, please use the link below. It will take you to a list of all of our available [facility], including address, phone number and hours of operation. Please do not delay care.  [facility]  If you or a family member do not have a primary care provider, use the link  below to schedule a visit and establish care. When you choose a [facility] primary care physician or advanced practice provider, you gain a long-term partner in health. Find a Primary Care Provider  Learn more about [facility]'s in-office and virtual care options: [facility]

## 2021-06-20 NOTE — Progress Notes (Signed)
Virtual Visit Consent   Marvena Tally, you are scheduled for a virtual visit with a [facility] provider today.     Just as with appointments in the office, your consent must be obtained to participate.  Your consent will be active for this visit and any virtual visit you may have with one of our providers in the next 365 days.     If you have a MyChart account, a copy of this consent can be sent to you electronically.  All virtual visits are billed to your insurance company just like a traditional visit in the office.    As this is a virtual visit, video technology does not allow for your provider to perform a traditional examination.  This may limit your provider's ability to fully assess your condition.  If your provider identifies any concerns that need to be evaluated in person or the need to arrange testing (such as labs, EKG, etc.), we will make arrangements to do so.     Although advances in technology are sophisticated, we cannot ensure that it will always work on either your end or our end.  If the connection with a video visit is poor, the visit may have to be switched to a telephone visit.  With either a video or telephone visit, we are not always able to ensure that we have a secure connection.     I need to obtain your verbal consent now.   Are you willing to proceed with your visit today?    Chinwe Lope has provided verbal consent on 06/20/2021 for a virtual visit (video or telephone).   Mar Daring, PA-C   Date: 06/20/2021 7:41 AM   Virtual Visit via Video Note   I, Mar Daring, connected with  Nehemiah Montee  ([id number], [birth year]) on 06/20/21 at  7:30 AM EDT by a video-enabled telemedicine application and verified that I am speaking with the correct person using two identifiers.  Location: Patient: Virtual Visit Location Patient: Home Provider: Virtual Visit Location Provider: Home Office   I discussed the limitations  of evaluation and management by telemedicine and the availability of in person appointments. The patient expressed understanding and agreed to proceed.    History of Present Illness: Aubri Gathright is a 50 y.o. who identifies as a female who was assigned female at birth, and is being seen today for insomnia.  HPI: Insomnia Primary symptoms: fragmented sleep, no difficulty falling asleep, frequent awakening, premature morning awakening.   The current episode started 1 to 4 weeks ago. The onset quality is sudden. The problem occurs nightly. The problem is unchanged. Exacerbated by: denies any recent stressors or changes. Past treatments include medication (melatonin, unisom). The treatment provided no relief. Typical bedtime:  8-10 P.M..  How long after going to bed to you fall asleep: less than 15 minutes.   Sleep duration: none.  PMH includes: no depression, no family stress or anxiety.  Prior diagnostic workup includes:  No prior workup.   Has had this in the past (4-5 years ago) but that was after her brother passed suddenly. Has not had since and has no current life change or stress that triggered this episode.  Problems:  Patient Active Problem List   Diagnosis Date Noted   Chronic midline low back pain without sciatica 07/31/2020   History of tubal ligation 09/22/2018    Allergies: No Known Allergies Medications:  Current Outpatient Medications:    traZODone (DESYREL) 50 MG tablet,  Take 0.5-1 tablets (25-50 mg total) by mouth at bedtime as needed for sleep., Disp: 30 tablet, Rfl: 3   cyclobenzaprine (FLEXERIL) 5 MG tablet, Take 1 tablet (5 mg total) by mouth at bedtime as needed for muscle spasms., Disp: 30 tablet, Rfl: 0   meloxicam (MOBIC) 7.5 MG tablet, TAKE 1 TABLET BY MOUTH EVERY DAY, Disp: 30 tablet, Rfl: 0  Observations/Objective: Patient is well-developed, well-nourished in no acute distress.  Resting comfortably at home.  Head is normocephalic, atraumatic.  No  labored breathing.  Speech is clear and coherent with logical content.  Patient is alert and oriented at baseline.    Assessment and Plan: 1. Primary insomnia - traZODone (DESYREL) 50 MG tablet; Take 0.5-1 tablets (25-50 mg total) by mouth at bedtime as needed for sleep.  Dispense: 30 tablet; Refill: 3  - Failed OTC options - Will trial Trazodone as above - Discussed sleep hygiene - Advised to schedule follow up with PCP in 3-4 weeks  Follow Up Instructions: I discussed the assessment and treatment plan with the patient. The patient was provided an opportunity to ask questions and all were answered. The patient agreed with the plan and demonstrated an understanding of the instructions.  A copy of instructions were sent to the patient via MyChart.  The patient was advised to call back or seek an in-person evaluation if the symptoms worsen or if the condition fails to improve as anticipated.  Time:  I spent 13 minutes with the patient via telehealth technology discussing the above problems/concerns.    Mar Daring, PA-C

## 2021-10-15 ENCOUNTER — Other Ambulatory Visit: Payer: Self-pay | Admitting: Family Medicine

## 2021-10-15 DIAGNOSIS — Z1231 Encounter for screening mammogram for malignant neoplasm of breast: Secondary | ICD-10-CM

## 2021-10-18 ENCOUNTER — Other Ambulatory Visit: Payer: Self-pay | Admitting: Oncology

## 2021-10-18 DIAGNOSIS — F5101 Primary insomnia: Secondary | ICD-10-CM

## 2021-10-24 ENCOUNTER — Ambulatory Visit (INDEPENDENT_AMBULATORY_CARE_PROVIDER_SITE_OTHER): Payer: BC Managed Care – PPO | Admitting: Family Medicine

## 2021-10-24 ENCOUNTER — Other Ambulatory Visit (HOSPITAL_COMMUNITY)
Admission: RE | Admit: 2021-10-24 | Discharge: 2021-10-24 | Disposition: A | Discharge status: Home / Self Care | Payer: BC Managed Care – PPO | Source: Ambulatory Visit | Attending: Family Medicine | Admitting: Family Medicine

## 2021-10-24 DIAGNOSIS — Z124 Encounter for screening for malignant neoplasm of cervix: Secondary | ICD-10-CM | POA: Insufficient documentation

## 2021-10-24 DIAGNOSIS — Z0000 Encounter for general adult medical examination without abnormal findings: Secondary | ICD-10-CM | POA: Diagnosis not present

## 2021-10-24 DIAGNOSIS — Z113 Encounter for screening for infections with a predominantly sexual mode of transmission: Secondary | ICD-10-CM | POA: Diagnosis not present

## 2021-10-24 DIAGNOSIS — E782 Mixed hyperlipidemia: Secondary | ICD-10-CM | POA: Diagnosis not present

## 2021-10-24 DIAGNOSIS — Z1159 Encounter for screening for other viral diseases: Secondary | ICD-10-CM

## 2021-10-24 DIAGNOSIS — Z1211 Encounter for screening for malignant neoplasm of colon: Secondary | ICD-10-CM

## 2021-10-24 LAB — CBC WITH DIFFERENTIAL/PLATELET
Basophils Absolute: 0 10*3/uL (ref 0.0–0.1)
Basophils Relative: 0.7 % (ref 0.0–3.0)
Eosinophils Absolute: 0.2 10*3/uL (ref 0.0–0.7)
Eosinophils Relative: 2.5 % (ref 0.0–5.0)
HCT: 37 % (ref 36.0–46.0)
Hemoglobin: 12.2 g/dL (ref 12.0–15.0)
Lymphocytes Relative: 39.5 % (ref 12.0–46.0)
Lymphs Abs: 2.4 10*3/uL (ref 0.7–4.0)
MCHC: 33 g/dL (ref 30.0–36.0)
MCV: 97 fl (ref 78.0–100.0)
Monocytes Absolute: 0.6 10*3/uL (ref 0.1–1.0)
Monocytes Relative: 9.1 % (ref 3.0–12.0)
Neutro Abs: 3 10*3/uL (ref 1.4–7.7)
Neutrophils Relative %: 48.2 % (ref 43.0–77.0)
Platelets: 328 10*3/uL (ref 150.0–400.0)
RBC: 3.81 Mil/uL — ABNORMAL LOW (ref 3.87–5.11)
RDW: 12.5 % (ref 11.5–15.5)
WBC: 6.2 10*3/uL (ref 4.0–10.5)

## 2021-10-24 LAB — BASIC METABOLIC PANEL
BUN: 12 mg/dL (ref 6–23)
CO2: 27 mEq/L (ref 19–32)
Calcium: 9.4 mg/dL (ref 8.4–10.5)
Chloride: 100 mEq/L (ref 96–112)
Creatinine, Ser: 0.82 mg/dL (ref 0.40–1.20)
GFR: 84.35 mL/min (ref >60.00)
Glucose, Bld: 96 mg/dL (ref 70–99)
Potassium: 3.9 mEq/L (ref 3.5–5.1)
Sodium: 136 mEq/L (ref 135–145)

## 2021-10-24 LAB — LIPID PANEL
Cholesterol: 212 mg/dL — ABNORMAL HIGH (ref 0–200)
HDL: 71.7 mg/dL (ref >39.00)
LDL Cholesterol: 132 mg/dL — ABNORMAL HIGH (ref 0–99)
NonHDL: 139.91
Total CHOL/HDL Ratio: 3
Triglycerides: 39 mg/dL (ref 0.0–149.0)
VLDL: 7.8 mg/dL (ref 0.0–40.0)

## 2021-10-24 LAB — HEMOGLOBIN A1C: Hgb A1c MFr Bld: 5.9 % (ref 4.6–6.5)

## 2021-10-24 LAB — T4, FREE: Free T4: 0.67 ng/dL (ref 0.60–1.60)

## 2021-10-24 LAB — TSH: TSH: 1.24 u[IU]/mL (ref 0.35–5.50)

## 2021-10-24 NOTE — Progress Notes (Signed)
Subjective:     Jody Mills is a 51 y.o. female and is here for a comprehensive physical exam. The patient reports doing well overall and is without complaint.  Patient mentions in the past she did not do well with anesthesia and does not want a colonoscopy for this reason.  Reports heart stopped for a few minutes during a procedure in [place].  Patient denies family history of colon cancer.  Last Pap 2019.  Last mammogram 11/21/2019.  Patien states she is not taking trazodone or any other medications.  Social History   Socioeconomic History   Marital status: Married    Spouse name: Not on file   Number of children: Not on file   Years of education: Not on file   Highest education level: Not on file  Occupational History   Not on file  Tobacco Use   Smoking status: Never   Smokeless tobacco: Never  Substance and Sexual Activity   Alcohol use: Yes   Drug use: Never   Sexual activity: Yes  Other Topics Concern   Not on file  Social History Narrative   Not on file   Social Determinants of Health   Financial Resource Strain: Not on file  Food Insecurity: Not on file  Transportation Needs: Not on file  Physical Activity: Not on file  Stress: Not on file  Social Connections: Not on file  Intimate Partner Violence: Not on file   Health Maintenance  Topic Date Due   Pneumococcal Vaccine 24-49 Years old (1 - PCV) Never done   Hepatitis C Screening  Never done   TETANUS/TDAP  Never done   COLONOSCOPY (Pts 45-54yr Insurance coverage will need to be confirmed)  Never done   PAP SMEAR-Modifier  09/30/2021   COVID-19 Vaccine (1) 11/09/2021 (Originally 07/02/1971)   INFLUENZA VACCINE  01/16/2022 (Originally 05/19/2021)   HIV Screening  Completed   HPV VACCINES  Aged Out    The following portions of the patient's history were reviewed and updated as appropriate: allergies, current medications, past family history, past medical history, past social history, past surgical  history, and problem list.  Review of Systems Pertinent items noted in HPI and remainder of comprehensive ROS otherwise negative.   Objective:    BP (P) 138/90 (BP Location: Left Arm, Patient Position: Sitting, Cuff Size: Normal)    Pulse (P) 75    Temp (P) 99.1 F (37.3 C) (Oral)    Wt (P) 200 lb (90.7 kg)    SpO2 (P) 97%    BMI (P) 33.28 kg/m  General appearance: alert, cooperative, and no distress Head: Normocephalic, without obvious abnormality, atraumatic Eyes: conjunctivae/corneas clear. PERRL, EOM's intact. Fundi benign. Ears: normal TM's and external ear canals both ears Nose: Nares normal. Septum midline. Mucosa normal. No drainage or sinus tenderness. Throat: lips, mucosa, and tongue normal; teeth and gums normal Neck: no adenopathy, no carotid bruit, no JVD, supple, symmetrical, trachea midline, and thyroid not enlarged, symmetric, no tenderness/mass/nodules Lungs: clear to auscultation bilaterally Heart: regular rate and rhythm, S1, S2 normal, no murmur, click, rub or gallop Abdomen: soft, non-tender; bowel sounds normal; no masses,  no organomegaly Pelvic: cervix normal in appearance, external genitalia normal, no adnexal masses or tenderness, no cervical motion tenderness, rectovaginal septum normal, uterus normal size, shape, and consistency, vagina normal without discharge, and cervix pointing down, thin whitish discharge noted in vaginal vault and at cervical os. Extremities: extremities normal, atraumatic, no cyanosis or edema Pulses: 2+ and symmetric Skin: Skin  color, texture, turgor normal. No rashes or lesions Lymph nodes: Cervical, supraclavicular, and axillary nodes normal. Neurologic: Alert and oriented X 3, normal strength and tone. Normal symmetric reflexes. Normal coordination and gait    Assessment:    Healthy female exam with whitish discharge noted on pelvic exam/Pap.   Plan:    Anticipatory guidance given including wearing seatbelts, smoke detectors in  the home, increasing physical activity, increasing p.o. intake of water and vegetables. -Obtain labs -Discussed immunizations.  Patient declines. -Patient encouraged to schedule mammogram -Discussed Cologuard for colonoscopy screening -Pap obtained this visit -Given handout -Next CPE in 1 year See After Visit Summary for Counseling Recommendations   Screening for cervical cancer  - Plan: PAP [[place]]  Routine screening for STI (sexually transmitted infection)  - Plan: PAP [[place]], RPR, HIV Antibody (routine testing w rflx), HIV Antibody (routine testing w rflx)  Mixed hyperlipidemia -Total cholesterol 230, LDL 147 on 09/30/2018 -Discussed the importance of lifestyle pt's  - Plan: Lipid panel  Encounter for hepatitis C screening test for low risk patient  - Plan: Hep C Antibody  Screen for colon cancer  - Plan: Cologuard  Follow-up as needed  Grier Mitts, MD

## 2021-10-24 NOTE — Patient Instructions (Signed)
As always it was great seeing you today.  I have ordered the Cologuard test for you.  It should be mailed to your home.  If you have not received the package let us know in the next few weeks.

## 2021-10-27 LAB — HEPATITIS C ANTIBODY
Hepatitis C Ab: NONREACTIVE
SIGNAL TO CUT-OFF: 0.03 (ref <1.00)

## 2021-10-27 LAB — CYTOLOGY - PAP
Adequacy: ABSENT
Chlamydia: NEGATIVE
Comment: NEGATIVE
Comment: NEGATIVE
Comment: NEGATIVE
Comment: NORMAL
Diagnosis: NEGATIVE
High risk HPV: NEGATIVE
Neisseria Gonorrhea: NEGATIVE
Trichomonas: NEGATIVE

## 2021-10-27 LAB — RPR: RPR Ser Ql: NONREACTIVE

## 2021-10-27 LAB — HIV ANTIBODY (ROUTINE TESTING W REFLEX): HIV 1&2 Ab, 4th Generation: NONREACTIVE

## 2021-10-30 ENCOUNTER — Encounter: Payer: Self-pay | Admitting: Family Medicine

## 2021-10-30 ENCOUNTER — Other Ambulatory Visit: Payer: Self-pay | Admitting: Family Medicine

## 2021-10-30 DIAGNOSIS — B9689 Other specified bacterial agents as the cause of diseases classified elsewhere: Secondary | ICD-10-CM

## 2021-10-30 MED ORDER — METRONIDAZOLE 500 MG PO TABS
500.0000 mg | ORAL_TABLET | Freq: Two times a day (BID) | ORAL | 0 refills | Status: AC
Start: 2021-10-30 — End: 2021-11-06

## 2021-11-04 ENCOUNTER — Ambulatory Visit
Admission: RE | Admit: 2021-11-04 | Discharge: 2021-11-04 | Disposition: A | Discharge status: Home / Self Care | Payer: BC Managed Care – PPO | Source: Ambulatory Visit

## 2021-11-04 ENCOUNTER — Other Ambulatory Visit: Payer: Self-pay

## 2021-11-04 DIAGNOSIS — Z1231 Encounter for screening mammogram for malignant neoplasm of breast: Secondary | ICD-10-CM

## 2021-11-07 LAB — COLOGUARD: COLOGUARD: NEGATIVE

## 2022-07-13 IMAGING — MG MM DIGITAL SCREENING BILAT W/ TOMO AND CAD
8 series · 8 of 24 positions shown · non-contrast
Comparison: Previous exam(s).

CLINICAL DATA: Screening.

EXAM:
DIGITAL SCREENING BILATERAL MAMMOGRAM WITH TOMOSYNTHESIS AND CAD
TECHNIQUE: Bilateral screening digital craniocaudal and mediolateral oblique
mammograms were obtained. Bilateral screening digital breast
tomosynthesis was performed. The images were evaluated with
computer-aided detection.

[L MLO synth-2D]
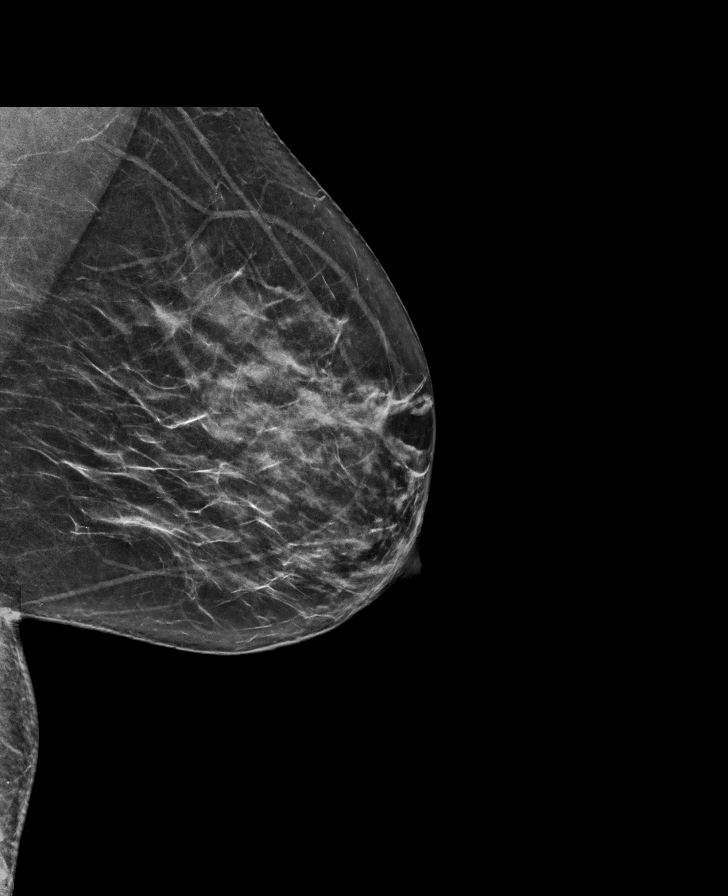

[L CC synth-2D]
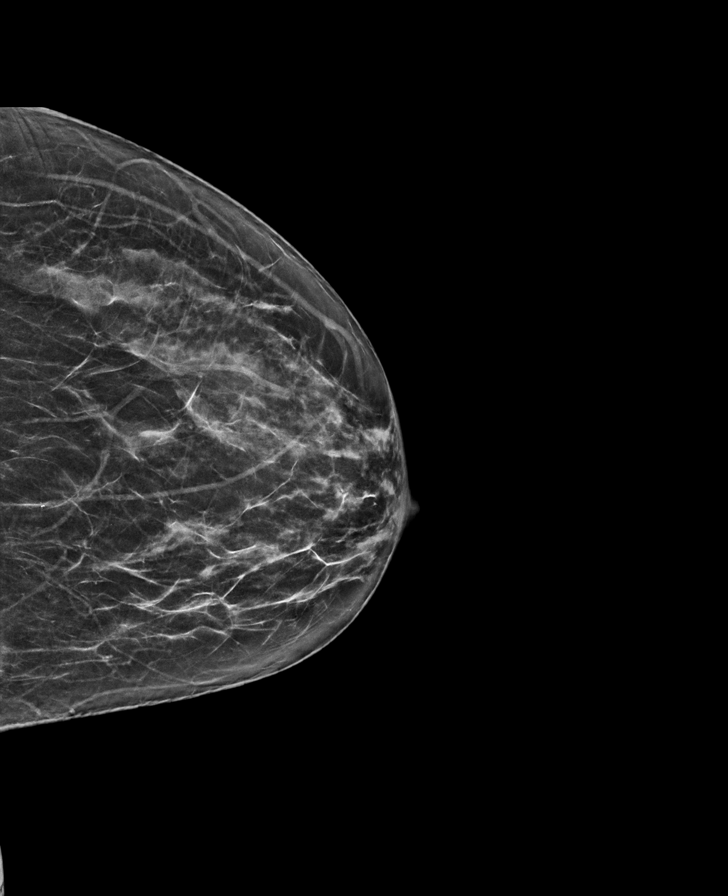

[R MLO synth-2D]
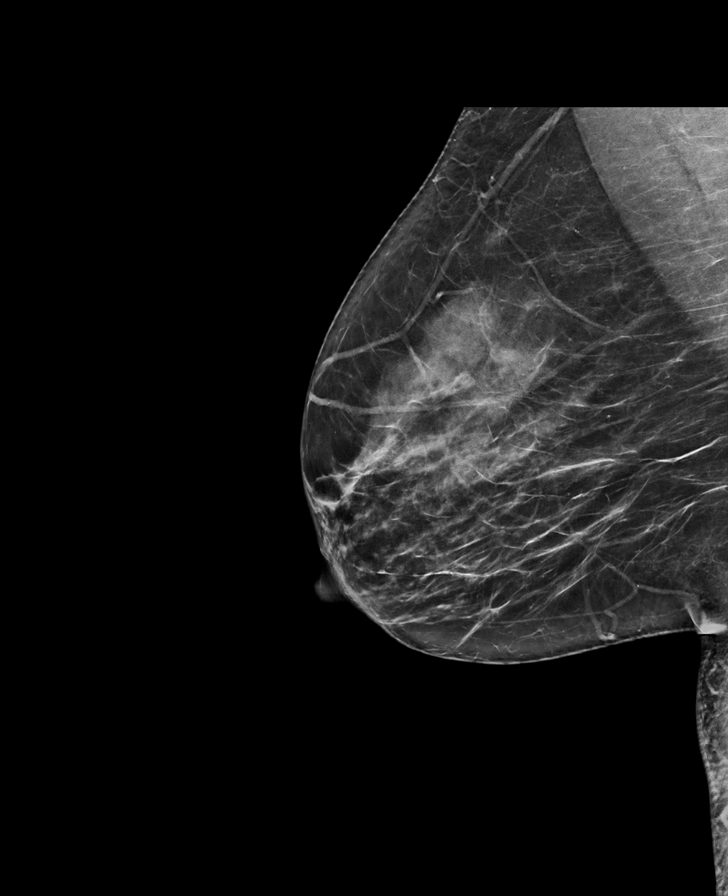

[R CC synth-2D]
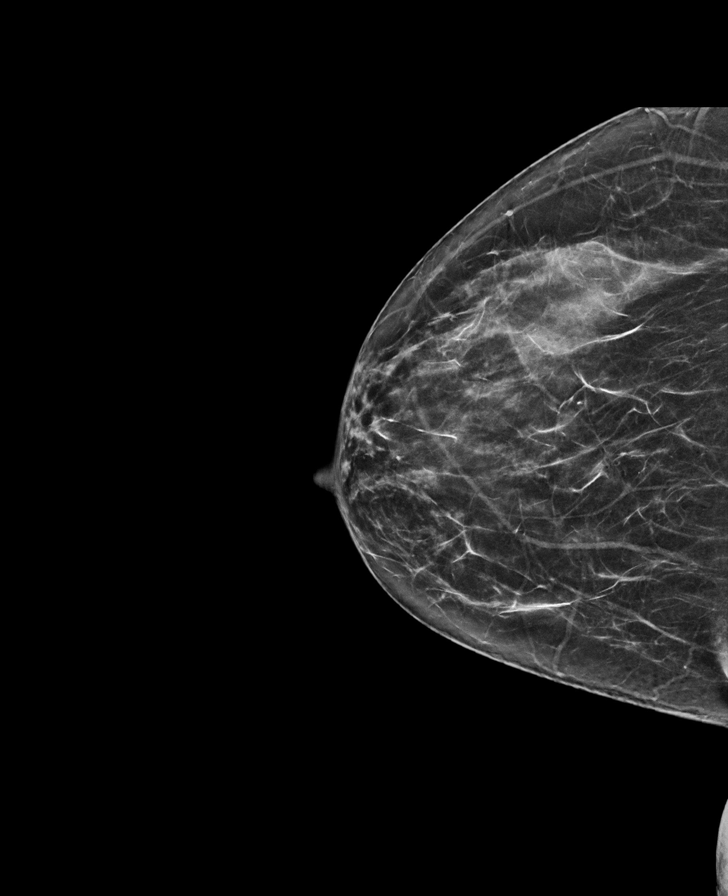

[R CC tomo · tomo slice 33/64.0]
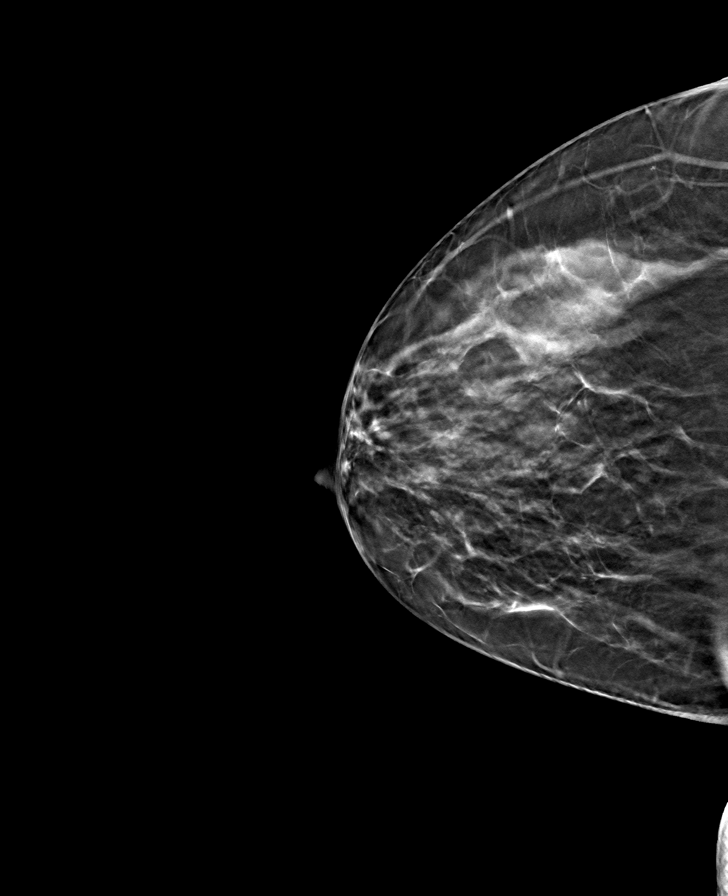

[L MLO tomo · tomo slice 33/66.0]
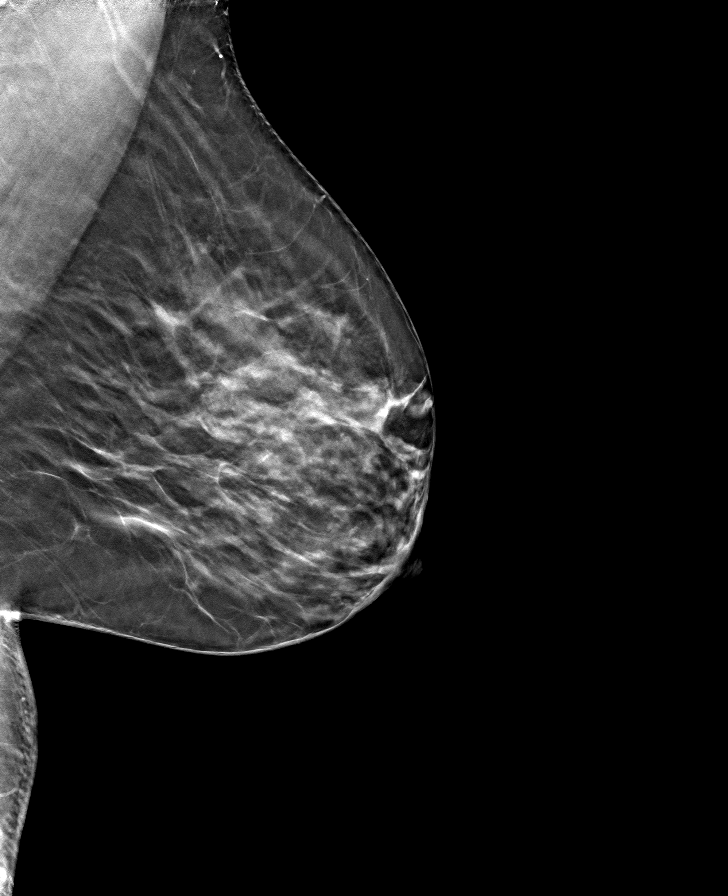

[R MLO tomo · tomo slice 35/70.0]
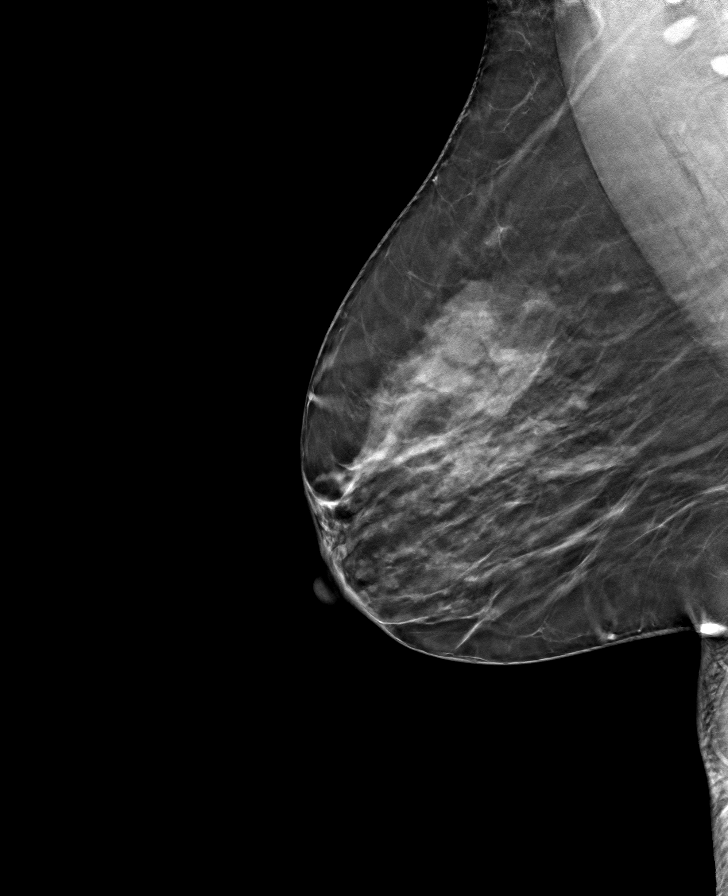

[L CC tomo · tomo slice 31/60.0]
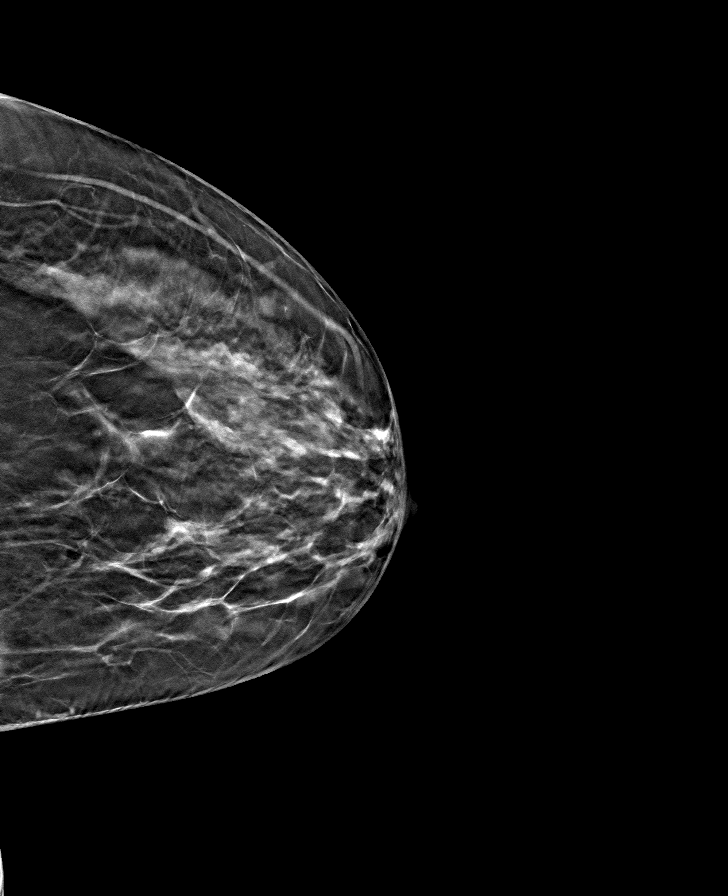

[8 of 24 positions shown; findings below may reference images not displayed]

ACR Breast Density Category c: The breast tissue is heterogeneously
dense, which may obscure small masses.
FINDINGS: There are no findings suspicious for malignancy.
IMPRESSION: No mammographic evidence of malignancy. A result letter of this
screening mammogram will be mailed directly to the patient.

RECOMMENDATION:
Screening mammogram in one year. (Code:Q3-W-BC3)

BI-RADS CATEGORY  1: Negative.

## 2023-02-15 ENCOUNTER — Other Ambulatory Visit: Payer: Self-pay | Admitting: Family Medicine

## 2023-02-15 DIAGNOSIS — Z1231 Encounter for screening mammogram for malignant neoplasm of breast: Secondary | ICD-10-CM

## 2023-03-19 ENCOUNTER — Ambulatory Visit: Payer: BC Managed Care – PPO

## 2023-05-20 ENCOUNTER — Ambulatory Visit
Admission: RE | Admit: 2023-05-20 | Discharge: 2023-05-20 | Disposition: A | Discharge status: Home / Self Care | Payer: BC Managed Care – PPO | Source: Ambulatory Visit | Attending: Family Medicine | Admitting: Family Medicine

## 2023-05-20 DIAGNOSIS — Z1231 Encounter for screening mammogram for malignant neoplasm of breast: Secondary | ICD-10-CM

## 2024-04-27 ENCOUNTER — Encounter: Payer: Self-pay | Admitting: Family Medicine

## 2024-04-27 ENCOUNTER — Ambulatory Visit: Admitting: Family Medicine

## 2024-04-27 VITALS — BP 120/76 | HR 56 | Temp 98.8°F | Ht 65.0 in | Wt 180.2 lb

## 2024-04-27 DIAGNOSIS — Z0000 Encounter for general adult medical examination without abnormal findings: Secondary | ICD-10-CM

## 2024-04-27 LAB — COMPREHENSIVE METABOLIC PANEL WITH GFR
ALT: 14 U/L (ref 0–35)
AST: 20 U/L (ref 0–37)
Albumin: 4.4 g/dL (ref 3.5–5.2)
Alkaline Phosphatase: 97 U/L (ref 39–117)
BUN: 11 mg/dL (ref 6–23)
CO2: 31 meq/L (ref 19–32)
Calcium: 9.5 mg/dL (ref 8.4–10.5)
Chloride: 101 meq/L (ref 96–112)
Creatinine, Ser: 0.9 mg/dL (ref 0.40–1.20)
GFR: 74.12 mL/min (ref >60.00)
Glucose, Bld: 99 mg/dL (ref 70–99)
Potassium: 3.9 meq/L (ref 3.5–5.1)
Sodium: 137 meq/L (ref 135–145)
Total Bilirubin: 0.4 mg/dL (ref 0.2–1.2)
Total Protein: 8.4 g/dL — ABNORMAL HIGH (ref 6.0–8.3)

## 2024-04-27 LAB — CBC WITH DIFFERENTIAL/PLATELET
Basophils Absolute: 0 K/uL (ref 0.0–0.1)
Basophils Relative: 0.4 % (ref 0.0–3.0)
Eosinophils Absolute: 0.2 K/uL (ref 0.0–0.7)
Eosinophils Relative: 3.5 % (ref 0.0–5.0)
HCT: 37.7 % (ref 36.0–46.0)
Hemoglobin: 12.6 g/dL (ref 12.0–15.0)
Lymphocytes Relative: 44.2 % (ref 12.0–46.0)
Lymphs Abs: 2.4 K/uL (ref 0.7–4.0)
MCHC: 33.5 g/dL (ref 30.0–36.0)
MCV: 96.5 fl (ref 78.0–100.0)
Monocytes Absolute: 0.4 K/uL (ref 0.1–1.0)
Monocytes Relative: 7.2 % (ref 3.0–12.0)
Neutro Abs: 2.4 K/uL (ref 1.4–7.7)
Neutrophils Relative %: 44.7 % (ref 43.0–77.0)
Platelets: 275 K/uL (ref 150.0–400.0)
RBC: 3.9 Mil/uL (ref 3.87–5.11)
RDW: 12.7 % (ref 11.5–15.5)
WBC: 5.4 K/uL (ref 4.0–10.5)

## 2024-04-27 LAB — LIPID PANEL
Cholesterol: 214 mg/dL — ABNORMAL HIGH (ref 0–200)
HDL: 65.1 mg/dL (ref >39.00)
LDL Cholesterol: 141 mg/dL — ABNORMAL HIGH (ref 0–99)
NonHDL: 148.85
Total CHOL/HDL Ratio: 3
Triglycerides: 40 mg/dL (ref 0.0–149.0)
VLDL: 8 mg/dL (ref 0.0–40.0)

## 2024-04-27 LAB — T4, FREE: Free T4: 0.73 ng/dL (ref 0.60–1.60)

## 2024-04-27 LAB — TSH: TSH: 0.96 u[IU]/mL (ref 0.35–5.50)

## 2024-04-27 LAB — HEMOGLOBIN A1C: Hgb A1c MFr Bld: 6 % (ref 4.6–6.5)

## 2024-04-27 NOTE — Progress Notes (Signed)
 Established Patient Office Visit   Subjective  Patient ID: Jody Mills, female    DOB: 1971-04-29  Age: 53 y.o. MRN: [id number]  Chief Complaint  Patient presents with   Annual Exam    Weight changes     Patient is a 53 year old female seen for CPE.  Patient previously lost to follow-up as last seen 2023.  Patient says she has been doing well overall.  Working on diet and exercise changes.  No longer eating red meat or pork.  States her body did not tolerate it well.  Patient also cut out drinking sodas.  Notes weight loss since making changes to diet.    Patient Active Problem List   Diagnosis Date Noted   Chronic midline low back pain without sciatica 07/31/2020   History of tubal ligation 09/22/2018   Past Medical History:  Diagnosis Date   Migraine    UTI (urinary tract infection)    Past Surgical History:  Procedure Laterality Date   TUBAL LIGATION     Social History   Tobacco Use   Smoking status: Never   Smokeless tobacco: Never  Substance Use Topics   Alcohol use: Yes   Drug use: Never   Family History  Problem Relation Age of Onset   Diabetes Mother    Heart disease Mother    Hypertension Mother    Hypertension Father    Diabetes Father    No Known Allergies  ROS Negative unless stated above    Objective:     BP 120/76 (BP Location: Left Arm, Patient Position: Sitting, Cuff Size: Normal)   Pulse (!) 56   Temp 98.8 F (37.1 C) (Oral)   Ht 5' 5 (1.651 m)   Wt 180 lb 3.2 oz (81.7 kg)   LMP 04/06/2024 (Exact Date)   SpO2 96%   BMI 29.99 kg/m  BP Readings from Last 3 Encounters:  04/27/24 120/76  10/24/21 (P) 138/90  07/31/20 110/78   Wt Readings from Last 3 Encounters:  04/27/24 180 lb 3.2 oz (81.7 kg)  10/24/21 (P) 200 lb (90.7 kg)  07/31/20 190 lb (86.2 kg)      Physical Exam Constitutional:      Appearance: Normal appearance.  HENT:     Head: Normocephalic and atraumatic.     Right Ear: Tympanic membrane, ear canal  and external ear normal.     Left Ear: Tympanic membrane, ear canal and external ear normal.     Nose: Nose normal.     Mouth/Throat:     Mouth: Mucous membranes are moist.     Pharynx: No oropharyngeal exudate or posterior oropharyngeal erythema.  Eyes:     General: No scleral icterus.    Extraocular Movements: Extraocular movements intact.     Conjunctiva/sclera: Conjunctivae normal.     Pupils: Pupils are equal, round, and reactive to light.  Neck:     Thyroid : No thyromegaly.  Cardiovascular:     Rate and Rhythm: Normal rate and regular rhythm.     Pulses: Normal pulses.     Heart sounds: Normal heart sounds. No murmur heard.    No friction rub.  Pulmonary:     Effort: Pulmonary effort is normal.     Breath sounds: Normal breath sounds. No wheezing, rhonchi or rales.  Abdominal:     General: Bowel sounds are normal.     Palpations: Abdomen is soft.     Tenderness: There is no abdominal tenderness.  Musculoskeletal:  General: No deformity. Normal range of motion.  Lymphadenopathy:     Cervical: No cervical adenopathy.  Skin:    General: Skin is warm and dry.     Findings: No lesion.  Neurological:     General: No focal deficit present.     Mental Status: She is alert and oriented to person, place, and time.  Psychiatric:        Mood and Affect: Mood normal.        Thought Content: Thought content normal.        04/27/2024    1:34 PM 10/24/2021    9:18 AM  Depression screen PHQ 2/9  Decreased Interest 0 0  Down, Depressed, Hopeless 0 0  PHQ - 2 Score 0 0  Altered sleeping 0 0  Tired, decreased energy 0 0  Change in appetite 0 0  Feeling bad or failure about yourself  0 0  Trouble concentrating 0 0  Moving slowly or fidgety/restless 0 0  Suicidal thoughts 0 0  PHQ-9 Score 0 0      04/27/2024    1:34 PM  GAD 7 : Generalized Anxiety Score  Nervous, Anxious, on Edge 0  Control/stop worrying 0  Worry too much - different things 0  Trouble relaxing 0   Restless 0  Easily annoyed or irritable 0  Afraid - awful might happen 0  Total GAD 7 Score 0     No results found for any visits on 04/27/24.    Assessment & Plan:   Well adult exam -     Comprehensive metabolic panel with GFR; Future -     CBC with Differential/Platelet; Future -     TSH; Future -     T4, free; Future -     Hemoglobin A1c; Future -     Lipid panel; Future  Age appropriate health screenings discussed.  Obtain labs.  Immunizations reviewed.  Cologuard done 11/07/2021 and normal.  Repeat in 2026.  Mammogram done 05/20/2023.  Pap done 10/27/2021.  Next CPE in 1 year  No follow-ups on file.   Clotilda JONELLE Single, MD

## 2024-05-04 ENCOUNTER — Ambulatory Visit: Payer: Self-pay | Admitting: Family Medicine

## 2024-05-15 ENCOUNTER — Other Ambulatory Visit: Payer: Self-pay | Admitting: Family Medicine

## 2024-05-15 DIAGNOSIS — Z1231 Encounter for screening mammogram for malignant neoplasm of breast: Secondary | ICD-10-CM

## 2024-05-23 ENCOUNTER — Ambulatory Visit
Admission: RE | Admit: 2024-05-23 | Discharge: 2024-05-23 | Disposition: A | Discharge status: Home / Self Care | Source: Ambulatory Visit

## 2024-05-23 DIAGNOSIS — Z1231 Encounter for screening mammogram for malignant neoplasm of breast: Secondary | ICD-10-CM
